# Patient Record
Sex: Female | Born: 1965
Health system: Southern US, Community
[De-identification: ages and names within clinical notes are randomized; demographics above are authoritative.]

## PROBLEM LIST (undated history)

## (undated) DIAGNOSIS — Z8742 Personal history of other diseases of the female genital tract: Secondary | ICD-10-CM

## (undated) DIAGNOSIS — D219 Benign neoplasm of connective and other soft tissue, unspecified: Secondary | ICD-10-CM

## (undated) HISTORY — DX: Personal history of other diseases of the female genital tract: Z87.42

## (undated) HISTORY — DX: Benign neoplasm of connective and other soft tissue, unspecified: D21.9

## (undated) HISTORY — PX: WISDOM TOOTH EXTRACTION: SHX21

---
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
## 1999-01-23 ENCOUNTER — Other Ambulatory Visit: Admission: RE | Admit: 1999-01-23 | Discharge: 1999-01-23 | Payer: Self-pay | Admitting: Obstetrics and Gynecology

## 2000-01-30 ENCOUNTER — Other Ambulatory Visit: Admission: RE | Admit: 2000-01-30 | Discharge: 2000-01-30 | Payer: Self-pay | Admitting: Obstetrics and Gynecology

## 2001-01-18 ENCOUNTER — Other Ambulatory Visit: Admission: RE | Admit: 2001-01-18 | Discharge: 2001-01-18 | Payer: Self-pay | Admitting: Obstetrics and Gynecology

## 2001-09-07 ENCOUNTER — Ambulatory Visit (HOSPITAL_COMMUNITY): Admission: RE | Admit: 2001-09-07 | Discharge: 2001-09-07 | Payer: Self-pay | Admitting: Internal Medicine

## 2001-09-08 HISTORY — PX: LEG SURGERY: SHX1003

## 2002-01-05 ENCOUNTER — Other Ambulatory Visit: Admission: RE | Admit: 2002-01-05 | Discharge: 2002-01-05 | Payer: Self-pay | Admitting: Obstetrics and Gynecology

## 2002-01-13 ENCOUNTER — Encounter: Payer: Self-pay | Admitting: Obstetrics and Gynecology

## 2002-01-13 ENCOUNTER — Ambulatory Visit (HOSPITAL_COMMUNITY): Admission: RE | Admit: 2002-01-13 | Discharge: 2002-01-13 | Payer: Self-pay | Admitting: Obstetrics and Gynecology

## 2003-01-18 ENCOUNTER — Other Ambulatory Visit: Admission: RE | Admit: 2003-01-18 | Discharge: 2003-01-18 | Payer: Self-pay | Admitting: Obstetrics and Gynecology

## 2004-01-10 ENCOUNTER — Other Ambulatory Visit: Admission: RE | Admit: 2004-01-10 | Discharge: 2004-01-10 | Payer: Self-pay | Admitting: Obstetrics and Gynecology

## 2004-12-25 ENCOUNTER — Ambulatory Visit (HOSPITAL_COMMUNITY): Admission: RE | Admit: 2004-12-25 | Discharge: 2004-12-25 | Payer: Self-pay | Admitting: Obstetrics and Gynecology

## 2005-01-15 ENCOUNTER — Other Ambulatory Visit: Admission: RE | Admit: 2005-01-15 | Discharge: 2005-01-15 | Payer: Self-pay | Admitting: Obstetrics and Gynecology

## 2005-12-29 ENCOUNTER — Ambulatory Visit (HOSPITAL_COMMUNITY): Admission: RE | Admit: 2005-12-29 | Discharge: 2005-12-29 | Payer: Self-pay | Admitting: Obstetrics and Gynecology

## 2006-01-12 ENCOUNTER — Other Ambulatory Visit: Admission: RE | Admit: 2006-01-12 | Discharge: 2006-01-12 | Payer: Self-pay | Admitting: Obstetrics and Gynecology

## 2006-08-06 ENCOUNTER — Other Ambulatory Visit: Admission: RE | Admit: 2006-08-06 | Discharge: 2006-08-06 | Payer: Self-pay | Admitting: Obstetrics and Gynecology

## 2006-12-31 ENCOUNTER — Ambulatory Visit (HOSPITAL_COMMUNITY): Admission: RE | Admit: 2006-12-31 | Discharge: 2006-12-31 | Payer: Self-pay | Admitting: Obstetrics and Gynecology

## 2007-02-05 ENCOUNTER — Encounter: Admission: RE | Admit: 2007-02-05 | Discharge: 2007-02-05 | Payer: Self-pay | Admitting: Obstetrics and Gynecology

## 2007-12-27 ENCOUNTER — Ambulatory Visit (HOSPITAL_COMMUNITY): Admission: RE | Admit: 2007-12-27 | Discharge: 2007-12-27 | Payer: Self-pay | Admitting: Obstetrics and Gynecology

## 2008-12-27 ENCOUNTER — Ambulatory Visit (HOSPITAL_COMMUNITY): Admission: RE | Admit: 2008-12-27 | Discharge: 2008-12-27 | Payer: Self-pay | Admitting: Obstetrics and Gynecology

## 2009-12-31 ENCOUNTER — Ambulatory Visit (HOSPITAL_COMMUNITY): Admission: RE | Admit: 2009-12-31 | Discharge: 2009-12-31 | Payer: Self-pay | Admitting: Obstetrics and Gynecology

## 2010-10-31 ENCOUNTER — Other Ambulatory Visit (HOSPITAL_COMMUNITY): Payer: Self-pay | Admitting: Obstetrics and Gynecology

## 2010-10-31 DIAGNOSIS — Z1231 Encounter for screening mammogram for malignant neoplasm of breast: Secondary | ICD-10-CM

## 2011-01-02 ENCOUNTER — Ambulatory Visit (HOSPITAL_COMMUNITY)
Admission: RE | Admit: 2011-01-02 | Discharge: 2011-01-02 | Disposition: A | Discharge status: Home / Self Care | Payer: BC Managed Care – PPO | Source: Ambulatory Visit | Attending: Obstetrics and Gynecology | Admitting: Obstetrics and Gynecology

## 2011-01-02 DIAGNOSIS — Z1231 Encounter for screening mammogram for malignant neoplasm of breast: Secondary | ICD-10-CM

## 2011-09-09 HISTORY — PX: CHOLECYSTECTOMY: SHX55

## 2011-12-09 ENCOUNTER — Other Ambulatory Visit: Payer: Self-pay | Admitting: Obstetrics and Gynecology

## 2011-12-09 DIAGNOSIS — Z1231 Encounter for screening mammogram for malignant neoplasm of breast: Secondary | ICD-10-CM

## 2012-01-05 ENCOUNTER — Ambulatory Visit (HOSPITAL_COMMUNITY)
Admission: RE | Admit: 2012-01-05 | Discharge: 2012-01-05 | Disposition: A | Discharge status: Home / Self Care | Payer: BC Managed Care – PPO | Source: Ambulatory Visit | Attending: Obstetrics and Gynecology | Admitting: Obstetrics and Gynecology

## 2012-01-05 DIAGNOSIS — Z1231 Encounter for screening mammogram for malignant neoplasm of breast: Secondary | ICD-10-CM

## 2012-01-15 ENCOUNTER — Telehealth: Payer: Self-pay | Admitting: Obstetrics and Gynecology

## 2012-01-15 NOTE — Telephone Encounter (Signed)
Triage pool.

## 2012-01-15 NOTE — Telephone Encounter (Signed)
To triage pool.

## 2012-01-15 NOTE — Telephone Encounter (Signed)
[facility] pt 

## 2012-01-16 NOTE — Telephone Encounter (Signed)
Lm on vm to cb per telephone call.  

## 2012-01-16 NOTE — Telephone Encounter (Signed)
Tc from pt. Pt with several ?'s rgdg bcp's. Questions answered. Appt sched 01-19-12 with [facility] for aex. Pt agrees.

## 2012-01-19 ENCOUNTER — Encounter: Payer: Self-pay | Admitting: Obstetrics and Gynecology

## 2012-01-19 ENCOUNTER — Ambulatory Visit (INDEPENDENT_AMBULATORY_CARE_PROVIDER_SITE_OTHER): Payer: BC Managed Care – PPO | Admitting: Obstetrics and Gynecology

## 2012-01-19 VITALS — BP 90/58 | Ht 67.0 in | Wt 136.0 lb

## 2012-01-19 DIAGNOSIS — Z124 Encounter for screening for malignant neoplasm of cervix: Secondary | ICD-10-CM

## 2012-01-19 DIAGNOSIS — N924 Excessive bleeding in the premenopausal period: Secondary | ICD-10-CM

## 2012-01-19 DIAGNOSIS — D251 Intramural leiomyoma of uterus: Secondary | ICD-10-CM

## 2012-01-19 NOTE — Progress Notes (Signed)
Last Pap: 01/03/2010  WNL: Yes Regular Periods:yes Contraception: vas  Monthly Breast exam:yes occ Tetanus<64yrs:no Nl.Bladder Function:yes Daily BMs:yes Healthy Diet:yes Calcium:yes Mammogram:yes 01/06/2012 Exercise:yes Seatbelt: yes Abuse at home: no Stressful work:no Sigmoid-colonoscopy: N/A Bone Density: No BMI=22  Entered by cw,cma  Subjective:    Kelly Andrade is a 46 y.o. female, who presents for annual exam.  The patient has no complaints today. She had a workup for abnormal uterine bleeding and has been placed on oral contraceptive pills with good relief. She has had 3 cycles that were predictable according to him the placebo pills in her package, lasting only a few days, and not at all heavy as they have been in the past. There also on associated with any cramping.  The following portions of the patient's history were reviewed and updated as appropriate: allergies, current medications, past family history, past medical history, past social history, past surgical history and problem list.  Review of Systems Pertinent items are noted in HPI. Gastrointestinal:No change in bowel habits, no abdominal pain, no rectal bleeding Genitourinary:negative for dysuria, frequency, hematuria, nocturia and urinary incontinence    Objective:     BP 90/58  Ht 5\' 7"  (1.702 m)  Wt 136 lb (61.689 kg)  BMI 21.30 kg/m2  LMP 01/09/2012  Weight:  Wt Readings from Last 1 Encounters:  01/19/12 136 lb (61.689 kg)     BMI: Body mass index is 21.30 kg/(m^2). General Appearance: Alert, appropriate appearance for age. No acute distress HEENT: Grossly normal Neck / Thyroid: Supple, no masses, nodes or enlargement Lungs: clear to auscultation bilaterally Back: No CVA tenderness Breast Exam: No masses or nodes.No dimpling, nipple retraction or discharge. Cardiovascular: Regular rate and rhythm. S1, S2, no murmur Gastrointestinal: Soft, non-tender, no masses or organomegaly Pelvic Exam:  External genitalia: normal general appearance Vaginal: normal mucosa without prolapse or lesions Cervix: normal appearance Adnexa: normal bimanual exam Uterus: upper limits normal size mobile and nontender Rectovaginal: normal rectal, no masses Lymphatic Exam: Non-palpable nodes in neck, clavicular, axillary, or inguinal regions Skin: no rash or abnormalities Neurologic: Normal gait and speech, no tremor  Psychiatric: Alert and oriented, appropriate affect.    Urinalysis:Not done      Assessment:    Perimenopausal bleeding pattern improved on combination sequential oral contraceptives  Small asymptomatic uterine fibroid diagnosed on ultrasound and stable on today's exam   Plan:    All questions answered. Diagnosis explained in detail, including differential. Discussed healthy lifestyle modifications. Agricultural engineer distributed. plan to continue combination oral contraceptive pills as currently prescribed for at least one year and reassess at next years annual examination   Follow-up:  for annual exam

## 2012-01-19 NOTE — Patient Instructions (Signed)
Perimenopause  Perimenopause is the time when your body begins to move into the menopause (no menstrual period for 12 straight months). It is a natural process. Perimenopause can begin 2 to 8 years before the menopause and usually lasts for one year after the menopause. During this time, your ovaries may or may not produce an egg. The ovaries vary in their production of estrogen and progesterone hormones each month. This can cause irregular menstrual periods, difficulty in getting pregnant, vaginal bleeding between periods and uncomfortable symptoms.  CAUSES   Irregular production of the ovarian hormones, estrogen and progesterone, and not ovulating every month.   Other causes include:   Tumor of the pituitary gland in the brain.   Medical disease that affects the ovaries.   Radiation treatment.   Chemotherapy.   Unknown causes.   Heavy smoking and excessive alcohol intake can bring on perimenopause sooner.  SYMPTOMS     Hot flashes.   Night sweats.   Irregular menstrual periods.   Decrease sex drive.   Vaginal dryness.   Headaches.   Mood swings.   Depression.   Memory problems.   Irritability.   Tiredness.   Weight gain.   Trouble getting pregnant.   The beginning of losing bone cells (osteoporosis).   The beginning of hardening of the arteries (atherosclerosis).  DIAGNOSIS    Your caregiver will make a diagnosis by analyzing your age, menstrual history and your symptoms. They will do a physical exam noting any changes in your body, especially your female organs. Female hormone tests may or may not be helpful depending on the amount and when you produce the female hormones. However, other hormone tests may be helpful (ex. thyroid hormone) to rule out other problems.  TREATMENT    The decision to treat during the perimenopause should be made by you and your caregiver depending on how the symptoms are affecting you and your life style. There are various treatments available such as:    Treating individual symptoms with a specific medication for that symptom (ex. tranquilizer for depression).   Herbal medications that can help specific symptoms.   Counseling.   Group therapy.   No treatment.  HOME CARE INSTRUCTIONS     Before seeing your caregiver, make a list of your menstrual periods (when the occur, how heavy they are, how long between periods and how long they last), your symptoms and when they started.   Take the medication as recommended by your caregiver.   Sleep and rest.   Exercise.   Eat a diet that contains calcium (good for your bones) and soy (acts like estrogen hormone).   Do not smoke.   Avoid alcoholic beverages.   Taking vitamin E may help in certain cases.   Take calcium and vitamin D supplements to help prevent bone loss.   Group therapy is sometimes helpful.   Acupuncture may help in some cases.  SEEK MEDICAL CARE IF:     You have any of the above and want to know if it is perimenopause.   You want advice and treatment for any of your symptoms mentioned above.   You need a referral to a specialist (gynecologist, psychiatrist or psychologist).  SEEK IMMEDIATE MEDICAL CARE IF:     You have vaginal bleeding.   Your period lasts longer than 8 days.   You periods are recurring sooner than 21 days.   You have bleeding after intercourse.   You have severe depression.   You have pain   when you urinate.   You have severe headaches.   You develop vision problems.  Document Released: 10/02/2004 Document Revised: 08/14/2011 Document Reviewed: 06/22/2008  [employer] Patient Information 2012 [employer].

## 2012-01-21 LAB — PAP IG AND HPV HIGH-RISK: HPV DNA High Risk: NOT DETECTED

## 2012-01-27 ENCOUNTER — Ambulatory Visit: Payer: Self-pay | Admitting: Obstetrics and Gynecology

## 2012-03-30 ENCOUNTER — Other Ambulatory Visit: Payer: Self-pay | Admitting: Obstetrics and Gynecology

## 2012-03-30 ENCOUNTER — Encounter: Payer: Self-pay | Admitting: Obstetrics and Gynecology

## 2012-03-30 MED ORDER — NORETHINDRONE ACET-ETHINYL EST 1-20 MG-MCG PO TABS: 1.0000 | ORAL_TABLET | Freq: Every day | ORAL | Status: DC

## 2012-03-30 NOTE — Telephone Encounter (Signed)
Chandra/[facility] pt °

## 2012-03-30 NOTE — Telephone Encounter (Signed)
Tc to pt per telephone call.  Pt needs rf for bc. Rx for Microgestin 1/20 on file e-pres to pharm on file. Pt voices understanding.

## 2012-08-09 ENCOUNTER — Telehealth: Payer: Self-pay

## 2012-08-09 NOTE — Telephone Encounter (Signed)
Lm on vm to cb per telephone voicemail rgdg menses.

## 2012-08-09 NOTE — Telephone Encounter (Signed)
Tc from pt. Pt c/o spotting starting on yesterday. Pt is on the end of 2nd week of her Junel 1/20. Pt admits to taking pills late x 2days(08/06/12 and 08/07/12). Informed pt spotting likely due to taking pills late. Pt admits to always taking pills on time. Informed pt to cont bcp's as directed. If irregular spotting reoccurs to call office. Pt voices understanding.

## 2012-10-08 ENCOUNTER — Telehealth: Payer: Self-pay | Admitting: Obstetrics and Gynecology

## 2012-10-08 NOTE — Telephone Encounter (Signed)
Msg forwarded to [facility]

## 2012-10-08 NOTE — Telephone Encounter (Signed)
Lm with female for pt to cb.

## 2012-10-08 NOTE — Telephone Encounter (Signed)
Tc from pt per telephone call. Pt still c/o irregular spotting with Junel 1/20. Pt denies missing any pills. LMP-approx 09/18/12 x 3-4 days vs normal 7 day cycle with lighter flow. Pt is now on in her 3rd week of pill pack. Will consult with [facility] per recs. Pt voices understanding.

## 2012-10-10 NOTE — Telephone Encounter (Signed)
pls schedule sonohyst at beginning of next pill pack

## 2012-10-11 ENCOUNTER — Other Ambulatory Visit: Payer: Self-pay

## 2012-10-11 DIAGNOSIS — N926 Irregular menstruation, unspecified: Secondary | ICD-10-CM

## 2012-10-11 MED ORDER — MISOPROSTOL 200 MCG PO TABS: ORAL_TABLET | ORAL | Status: DC

## 2012-10-11 NOTE — Addendum Note (Signed)
Addended by: Lerry Liner D on: 10/11/2012 02:05 PM   Modules accepted: Orders

## 2012-10-11 NOTE — Telephone Encounter (Signed)
Lm on vm to cb per [facility] recs.  

## 2012-10-11 NOTE — Telephone Encounter (Signed)
Tc from pt. SHG sched 10/26/12 @ 3:30p with [facility]. Instructions for [facility] and [facility] mailed to pt. Pt due to start pills on 10/20/12. Pt agrees.

## 2012-10-12 ENCOUNTER — Other Ambulatory Visit: Payer: Self-pay | Admitting: Obstetrics and Gynecology

## 2012-10-12 DIAGNOSIS — N926 Irregular menstruation, unspecified: Secondary | ICD-10-CM

## 2012-10-12 NOTE — Telephone Encounter (Signed)
Review of chart reveals pt had SHG 06/2011.  Will not repeat at this time.  Will plan to see pt at visit for plan of care.

## 2012-10-13 NOTE — Telephone Encounter (Signed)
Tc to pt per [facility] recs.  Appt sched 10/15/12 @ 1:00p. Pt agrees.

## 2012-10-14 DIAGNOSIS — Z8742 Personal history of other diseases of the female genital tract: Secondary | ICD-10-CM | POA: Insufficient documentation

## 2012-10-14 DIAGNOSIS — D219 Benign neoplasm of connective and other soft tissue, unspecified: Secondary | ICD-10-CM | POA: Insufficient documentation

## 2012-10-15 ENCOUNTER — Encounter: Payer: Self-pay | Admitting: Obstetrics and Gynecology

## 2012-10-15 ENCOUNTER — Ambulatory Visit: Payer: BC Managed Care – PPO | Admitting: Obstetrics and Gynecology

## 2012-10-15 VITALS — BP 92/60 | Wt 143.0 lb

## 2012-10-15 DIAGNOSIS — N939 Abnormal uterine and vaginal bleeding, unspecified: Secondary | ICD-10-CM

## 2012-10-15 NOTE — Progress Notes (Signed)
GYN PROBLEM VISIT  Subjective: When did bleeding start: 10/08/2012.  Had similar episode in 08/08/12 with pill pack ending on 08/17/12, but no withdrawal bleeding occurred that cycle. January withdrawal cycle was normal, this bleeding started 6 days prior to end of Junel1/20. Has had nl menses since starting pills in 10/2011.  But her spontaneous menses have always been irregular How  Long: Still Bleeding  How often changing pad/tampon: 2-3 times a day  Bleeding Disorders: no Cramping: yes Contraception: yes Fibroids: no Hormone Therapy: no New Medications: no Menopausal Symptoms: no Vag. Discharge: no Abdominal Pain: no Increased Stress: yes.  Tax season at work.  Husband s/p several eye surgeries  Objective: BP 92/60  Wt 143 lb (64.864 kg)  BMI 22.39 kg/m2  LMP 10/08/2012  Pelvic exam:   VULVA: normal appearing vulva with no masses, tenderness or lesions,     VAGINA: normal appearing vagina with normal color and discharge, no lesions,     CERVIX: normal appearing cervix without discharge or lesions,     UTERUS: enlarged to 8 week's size, irregular, mobile,     ADNEXA: normal adnexa in size, nontender and no masses Assessment: Recurrent irreg menses after almost a year of regular menses on BCPs  Rec: SHG and possible endobx.  Last done about 2 years ago.

## 2012-10-19 ENCOUNTER — Other Ambulatory Visit: Payer: Self-pay

## 2012-10-19 MED ORDER — MISOPROSTOL 200 MCG PO TABS: ORAL_TABLET | ORAL | Status: DC

## 2012-10-26 ENCOUNTER — Ambulatory Visit: Payer: BC Managed Care – PPO

## 2012-10-26 ENCOUNTER — Encounter: Payer: Self-pay | Admitting: Obstetrics and Gynecology

## 2012-10-26 ENCOUNTER — Other Ambulatory Visit: Payer: Self-pay | Admitting: Obstetrics and Gynecology

## 2012-10-26 ENCOUNTER — Other Ambulatory Visit: Payer: BC Managed Care – PPO

## 2012-10-26 ENCOUNTER — Ambulatory Visit: Payer: BC Managed Care – PPO | Admitting: Obstetrics and Gynecology

## 2012-10-26 ENCOUNTER — Encounter: Payer: BC Managed Care – PPO | Admitting: Obstetrics and Gynecology

## 2012-10-26 VITALS — BP 106/68

## 2012-10-26 DIAGNOSIS — N926 Irregular menstruation, unspecified: Secondary | ICD-10-CM

## 2012-10-26 DIAGNOSIS — IMO0002 Reserved for concepts with insufficient information to code with codable children: Secondary | ICD-10-CM

## 2012-10-26 DIAGNOSIS — N939 Abnormal uterine and vaginal bleeding, unspecified: Secondary | ICD-10-CM

## 2012-10-26 NOTE — Patient Instructions (Signed)

## 2012-10-26 NOTE — Progress Notes (Signed)
SONOHYSTEROGRAM  Indications for the procedure, risks and benefits have been reviewed.  Questions were answered.   A permit has been signed.  An ultrasound was performed.   PROCEDURE The vagina and cervix were prepped with Betadine.  The sonohysterogram catheter was placed inside the uterus.  15 cc of sterile saline were injected.  A 3-D ultrasound was performed..   The patient tolerated her procedure well.  All instruments were removed.  The patient was returned to the supine position.   Findings  ULTRASOUND: Uterus: Length: 6.53 cm   Width:  6.00 cm   Height:  5.30 cm Endo thickness:  4.05 mm   Left ovary:Normal Right ovary:Normal Fibroids:no  CDS fluid:no Comment: Endometrial cavity width=3.1cm. Endometrial cavity length-4.8cm. Normal adnexas. Normal endometrium.  No endometrial masses on 3D rendering  BP 106/68  LMP 10/08/2012  Assessment:   Abnormal uterine bleeding without lesion noted Long hx of abnl uterine bleeding and amenorrhea.  Plan: Options for managemnt which had been reviewed with pt about 18 mos ago were again reviewed.  She declined IUD, ablation, hysterectomy, and wants to continue her current BCPs as long as no health threatening lesion is found. Menstrual calendar.  Will call with more than 10 days of consecutve bleeding RTO in 3 months

## 2012-11-10 ENCOUNTER — Other Ambulatory Visit: Payer: Self-pay | Admitting: Obstetrics and Gynecology

## 2012-11-10 DIAGNOSIS — Z1231 Encounter for screening mammogram for malignant neoplasm of breast: Secondary | ICD-10-CM

## 2013-01-05 ENCOUNTER — Ambulatory Visit (HOSPITAL_COMMUNITY)
Admission: RE | Admit: 2013-01-05 | Discharge: 2013-01-05 | Disposition: A | Discharge status: Home / Self Care | Payer: BC Managed Care – PPO | Source: Ambulatory Visit | Attending: Obstetrics and Gynecology | Admitting: Obstetrics and Gynecology

## 2013-01-05 DIAGNOSIS — Z1231 Encounter for screening mammogram for malignant neoplasm of breast: Secondary | ICD-10-CM | POA: Insufficient documentation

## 2013-01-06 ENCOUNTER — Other Ambulatory Visit: Payer: Self-pay | Admitting: Obstetrics and Gynecology

## 2013-01-06 DIAGNOSIS — R928 Other abnormal and inconclusive findings on diagnostic imaging of breast: Secondary | ICD-10-CM

## 2013-01-20 ENCOUNTER — Ambulatory Visit
Admission: RE | Admit: 2013-01-20 | Discharge: 2013-01-20 | Disposition: A | Discharge status: Home / Self Care | Payer: BC Managed Care – PPO | Source: Ambulatory Visit | Attending: Obstetrics and Gynecology | Admitting: Obstetrics and Gynecology

## 2013-01-20 DIAGNOSIS — R928 Other abnormal and inconclusive findings on diagnostic imaging of breast: Secondary | ICD-10-CM

## 2013-06-13 ENCOUNTER — Other Ambulatory Visit: Payer: Self-pay | Admitting: Obstetrics and Gynecology

## 2013-06-13 DIAGNOSIS — R921 Mammographic calcification found on diagnostic imaging of breast: Secondary | ICD-10-CM

## 2013-07-20 ENCOUNTER — Ambulatory Visit
Admission: RE | Admit: 2013-07-20 | Discharge: 2013-07-20 | Disposition: A | Discharge status: Home / Self Care | Payer: BC Managed Care – PPO | Source: Ambulatory Visit | Attending: Obstetrics and Gynecology | Admitting: Obstetrics and Gynecology

## 2013-07-20 DIAGNOSIS — R921 Mammographic calcification found on diagnostic imaging of breast: Secondary | ICD-10-CM

## 2013-11-07 ENCOUNTER — Other Ambulatory Visit: Payer: Self-pay | Admitting: Obstetrics and Gynecology

## 2013-11-07 DIAGNOSIS — R921 Mammographic calcification found on diagnostic imaging of breast: Secondary | ICD-10-CM

## 2014-01-09 ENCOUNTER — Encounter (INDEPENDENT_AMBULATORY_CARE_PROVIDER_SITE_OTHER): Payer: Self-pay

## 2014-01-09 ENCOUNTER — Ambulatory Visit
Admission: RE | Admit: 2014-01-09 | Discharge: 2014-01-09 | Disposition: A | Discharge status: Home / Self Care | Payer: BC Managed Care – PPO | Source: Ambulatory Visit | Attending: Obstetrics and Gynecology | Admitting: Obstetrics and Gynecology

## 2014-01-09 DIAGNOSIS — R921 Mammographic calcification found on diagnostic imaging of breast: Secondary | ICD-10-CM

## 2014-07-10 ENCOUNTER — Encounter: Payer: Self-pay | Admitting: Obstetrics and Gynecology

## 2014-10-31 ENCOUNTER — Other Ambulatory Visit: Payer: Self-pay | Admitting: Obstetrics and Gynecology

## 2014-10-31 DIAGNOSIS — R921 Mammographic calcification found on diagnostic imaging of breast: Secondary | ICD-10-CM

## 2015-01-16 ENCOUNTER — Ambulatory Visit
Admission: RE | Admit: 2015-01-16 | Discharge: 2015-01-16 | Disposition: A | Discharge status: Home / Self Care | Payer: BLUE CROSS/BLUE SHIELD | Source: Ambulatory Visit | Attending: Obstetrics and Gynecology | Admitting: Obstetrics and Gynecology

## 2015-01-16 ENCOUNTER — Other Ambulatory Visit: Payer: Self-pay | Admitting: Obstetrics and Gynecology

## 2015-01-16 DIAGNOSIS — R921 Mammographic calcification found on diagnostic imaging of breast: Secondary | ICD-10-CM

## 2016-04-14 DIAGNOSIS — Z0000 Encounter for general adult medical examination without abnormal findings: Secondary | ICD-10-CM | POA: Diagnosis not present

## 2016-04-14 DIAGNOSIS — Z131 Encounter for screening for diabetes mellitus: Secondary | ICD-10-CM | POA: Diagnosis not present

## 2016-04-14 DIAGNOSIS — Z13220 Encounter for screening for lipoid disorders: Secondary | ICD-10-CM | POA: Diagnosis not present

## 2016-09-29 DIAGNOSIS — H6980 Other specified disorders of Eustachian tube, unspecified ear: Secondary | ICD-10-CM | POA: Diagnosis not present

## 2016-10-02 DIAGNOSIS — Z23 Encounter for immunization: Secondary | ICD-10-CM | POA: Diagnosis not present

## 2016-12-29 DIAGNOSIS — N926 Irregular menstruation, unspecified: Secondary | ICD-10-CM | POA: Diagnosis not present

## 2016-12-29 DIAGNOSIS — Z1231 Encounter for screening mammogram for malignant neoplasm of breast: Secondary | ICD-10-CM | POA: Diagnosis not present

## 2016-12-29 DIAGNOSIS — Z01411 Encounter for gynecological examination (general) (routine) with abnormal findings: Secondary | ICD-10-CM | POA: Diagnosis not present

## 2016-12-29 DIAGNOSIS — D259 Leiomyoma of uterus, unspecified: Secondary | ICD-10-CM | POA: Diagnosis not present

## 2016-12-29 DIAGNOSIS — Z124 Encounter for screening for malignant neoplasm of cervix: Secondary | ICD-10-CM | POA: Diagnosis not present

## 2017-01-05 DIAGNOSIS — Z1283 Encounter for screening for malignant neoplasm of skin: Secondary | ICD-10-CM | POA: Diagnosis not present

## 2017-01-05 DIAGNOSIS — L821 Other seborrheic keratosis: Secondary | ICD-10-CM | POA: Diagnosis not present

## 2017-01-29 DIAGNOSIS — N951 Menopausal and female climacteric states: Secondary | ICD-10-CM | POA: Diagnosis not present

## 2017-04-07 DIAGNOSIS — Z13220 Encounter for screening for lipoid disorders: Secondary | ICD-10-CM | POA: Diagnosis not present

## 2017-04-07 DIAGNOSIS — Z131 Encounter for screening for diabetes mellitus: Secondary | ICD-10-CM | POA: Diagnosis not present

## 2017-04-07 DIAGNOSIS — Z0000 Encounter for general adult medical examination without abnormal findings: Secondary | ICD-10-CM | POA: Diagnosis not present

## 2017-10-12 DIAGNOSIS — K648 Other hemorrhoids: Secondary | ICD-10-CM | POA: Diagnosis not present

## 2017-10-12 DIAGNOSIS — K5900 Constipation, unspecified: Secondary | ICD-10-CM | POA: Diagnosis not present

## 2017-10-20 DIAGNOSIS — S0501XA Injury of conjunctiva and corneal abrasion without foreign body, right eye, initial encounter: Secondary | ICD-10-CM | POA: Diagnosis not present

## 2017-12-28 DIAGNOSIS — N926 Irregular menstruation, unspecified: Secondary | ICD-10-CM | POA: Diagnosis not present

## 2017-12-28 DIAGNOSIS — Z01411 Encounter for gynecological examination (general) (routine) with abnormal findings: Secondary | ICD-10-CM | POA: Diagnosis not present

## 2017-12-28 DIAGNOSIS — D259 Leiomyoma of uterus, unspecified: Secondary | ICD-10-CM | POA: Diagnosis not present

## 2017-12-30 DIAGNOSIS — Z1231 Encounter for screening mammogram for malignant neoplasm of breast: Secondary | ICD-10-CM | POA: Diagnosis not present

## 2018-01-05 ENCOUNTER — Other Ambulatory Visit: Payer: Self-pay | Admitting: Obstetrics and Gynecology

## 2018-01-05 DIAGNOSIS — R928 Other abnormal and inconclusive findings on diagnostic imaging of breast: Secondary | ICD-10-CM

## 2018-01-05 DIAGNOSIS — Z23 Encounter for immunization: Secondary | ICD-10-CM | POA: Diagnosis not present

## 2018-01-07 ENCOUNTER — Ambulatory Visit
Admission: RE | Admit: 2018-01-07 | Discharge: 2018-01-07 | Disposition: A | Discharge status: Home / Self Care | Payer: BLUE CROSS/BLUE SHIELD | Source: Ambulatory Visit | Attending: Obstetrics and Gynecology | Admitting: Obstetrics and Gynecology

## 2018-01-07 DIAGNOSIS — N6311 Unspecified lump in the right breast, upper outer quadrant: Secondary | ICD-10-CM | POA: Diagnosis not present

## 2018-01-07 DIAGNOSIS — R928 Other abnormal and inconclusive findings on diagnostic imaging of breast: Secondary | ICD-10-CM

## 2018-01-07 DIAGNOSIS — R922 Inconclusive mammogram: Secondary | ICD-10-CM | POA: Diagnosis not present

## 2018-02-03 DIAGNOSIS — D225 Melanocytic nevi of trunk: Secondary | ICD-10-CM | POA: Diagnosis not present

## 2018-02-03 DIAGNOSIS — Z1283 Encounter for screening for malignant neoplasm of skin: Secondary | ICD-10-CM | POA: Diagnosis not present

## 2018-02-03 DIAGNOSIS — J040 Acute laryngitis: Secondary | ICD-10-CM | POA: Diagnosis not present

## 2018-03-03 DIAGNOSIS — Z0001 Encounter for general adult medical examination with abnormal findings: Secondary | ICD-10-CM | POA: Diagnosis not present

## 2018-04-01 DIAGNOSIS — N926 Irregular menstruation, unspecified: Secondary | ICD-10-CM | POA: Diagnosis not present

## 2018-04-01 DIAGNOSIS — D259 Leiomyoma of uterus, unspecified: Secondary | ICD-10-CM | POA: Diagnosis not present

## 2018-04-01 DIAGNOSIS — N958 Other specified menopausal and perimenopausal disorders: Secondary | ICD-10-CM | POA: Diagnosis not present

## 2018-04-14 DIAGNOSIS — N958 Other specified menopausal and perimenopausal disorders: Secondary | ICD-10-CM | POA: Diagnosis not present

## 2018-04-14 DIAGNOSIS — N939 Abnormal uterine and vaginal bleeding, unspecified: Secondary | ICD-10-CM | POA: Diagnosis not present

## 2018-09-17 ENCOUNTER — Other Ambulatory Visit: Payer: Self-pay | Admitting: Obstetrics & Gynecology

## 2018-09-17 DIAGNOSIS — Z1231 Encounter for screening mammogram for malignant neoplasm of breast: Secondary | ICD-10-CM

## 2019-01-03 ENCOUNTER — Ambulatory Visit: Payer: BLUE CROSS/BLUE SHIELD

## 2019-01-18 DIAGNOSIS — Z8342 Family history of familial hypercholesterolemia: Secondary | ICD-10-CM | POA: Diagnosis not present

## 2019-01-18 DIAGNOSIS — Z131 Encounter for screening for diabetes mellitus: Secondary | ICD-10-CM | POA: Diagnosis not present

## 2019-01-18 DIAGNOSIS — Z0000 Encounter for general adult medical examination without abnormal findings: Secondary | ICD-10-CM | POA: Diagnosis not present

## 2019-02-02 DIAGNOSIS — L821 Other seborrheic keratosis: Secondary | ICD-10-CM | POA: Diagnosis not present

## 2019-02-02 DIAGNOSIS — Z1283 Encounter for screening for malignant neoplasm of skin: Secondary | ICD-10-CM | POA: Diagnosis not present

## 2019-02-14 ENCOUNTER — Other Ambulatory Visit: Payer: Self-pay

## 2019-02-14 ENCOUNTER — Ambulatory Visit
Admission: RE | Admit: 2019-02-14 | Discharge: 2019-02-14 | Disposition: A | Discharge status: Home / Self Care | Payer: BLUE CROSS/BLUE SHIELD | Source: Ambulatory Visit | Attending: Obstetrics & Gynecology | Admitting: Obstetrics & Gynecology

## 2019-02-14 DIAGNOSIS — Z1231 Encounter for screening mammogram for malignant neoplasm of breast: Secondary | ICD-10-CM

## 2019-02-17 DIAGNOSIS — Z01419 Encounter for gynecological examination (general) (routine) without abnormal findings: Secondary | ICD-10-CM | POA: Diagnosis not present

## 2019-04-01 DIAGNOSIS — R61 Generalized hyperhidrosis: Secondary | ICD-10-CM | POA: Diagnosis not present

## 2019-04-07 DIAGNOSIS — Z780 Asymptomatic menopausal state: Secondary | ICD-10-CM | POA: Diagnosis not present

## 2019-04-07 DIAGNOSIS — R61 Generalized hyperhidrosis: Secondary | ICD-10-CM | POA: Diagnosis not present

## 2019-05-13 DIAGNOSIS — Z780 Asymptomatic menopausal state: Secondary | ICD-10-CM | POA: Diagnosis not present

## 2019-05-13 DIAGNOSIS — R61 Generalized hyperhidrosis: Secondary | ICD-10-CM | POA: Diagnosis not present

## 2019-06-03 DIAGNOSIS — Z6831 Body mass index (BMI) 31.0-31.9, adult: Secondary | ICD-10-CM | POA: Diagnosis not present

## 2019-06-03 DIAGNOSIS — Z20828 Contact with and (suspected) exposure to other viral communicable diseases: Secondary | ICD-10-CM | POA: Diagnosis not present

## 2019-08-25 DIAGNOSIS — S61209A Unspecified open wound of unspecified finger without damage to nail, initial encounter: Secondary | ICD-10-CM | POA: Diagnosis not present

## 2019-10-11 ENCOUNTER — Other Ambulatory Visit: Payer: Self-pay | Admitting: Obstetrics & Gynecology

## 2019-10-11 DIAGNOSIS — Z1231 Encounter for screening mammogram for malignant neoplasm of breast: Secondary | ICD-10-CM

## 2020-01-05 DIAGNOSIS — Z01411 Encounter for gynecological examination (general) (routine) with abnormal findings: Secondary | ICD-10-CM | POA: Diagnosis not present

## 2020-02-01 DIAGNOSIS — Z1283 Encounter for screening for malignant neoplasm of skin: Secondary | ICD-10-CM | POA: Diagnosis not present

## 2020-02-01 DIAGNOSIS — D225 Melanocytic nevi of trunk: Secondary | ICD-10-CM | POA: Diagnosis not present

## 2020-02-08 DIAGNOSIS — Z5181 Encounter for therapeutic drug level monitoring: Secondary | ICD-10-CM | POA: Diagnosis not present

## 2020-02-08 DIAGNOSIS — B351 Tinea unguium: Secondary | ICD-10-CM | POA: Diagnosis not present

## 2020-02-08 DIAGNOSIS — M79672 Pain in left foot: Secondary | ICD-10-CM | POA: Diagnosis not present

## 2020-02-08 DIAGNOSIS — Z13220 Encounter for screening for lipoid disorders: Secondary | ICD-10-CM | POA: Diagnosis not present

## 2020-02-08 DIAGNOSIS — Z0000 Encounter for general adult medical examination without abnormal findings: Secondary | ICD-10-CM | POA: Diagnosis not present

## 2020-02-15 ENCOUNTER — Ambulatory Visit
Admission: RE | Admit: 2020-02-15 | Discharge: 2020-02-15 | Disposition: A | Discharge status: Home / Self Care | Payer: BC Managed Care – PPO | Source: Ambulatory Visit | Attending: Obstetrics & Gynecology | Admitting: Obstetrics & Gynecology

## 2020-02-15 ENCOUNTER — Other Ambulatory Visit: Payer: Self-pay

## 2020-02-15 DIAGNOSIS — Z1231 Encounter for screening mammogram for malignant neoplasm of breast: Secondary | ICD-10-CM

## 2020-02-17 ENCOUNTER — Other Ambulatory Visit: Payer: Self-pay

## 2020-02-17 ENCOUNTER — Ambulatory Visit: Payer: BC Managed Care – PPO | Admitting: Podiatry

## 2020-02-17 ENCOUNTER — Ambulatory Visit (INDEPENDENT_AMBULATORY_CARE_PROVIDER_SITE_OTHER): Payer: BC Managed Care – PPO

## 2020-02-17 ENCOUNTER — Encounter: Payer: Self-pay | Admitting: Podiatry

## 2020-02-17 ENCOUNTER — Other Ambulatory Visit: Payer: Self-pay | Admitting: Obstetrics & Gynecology

## 2020-02-17 DIAGNOSIS — M79672 Pain in left foot: Secondary | ICD-10-CM

## 2020-02-17 DIAGNOSIS — R928 Other abnormal and inconclusive findings on diagnostic imaging of breast: Secondary | ICD-10-CM

## 2020-02-17 DIAGNOSIS — Q667 Congenital pes cavus, unspecified foot: Secondary | ICD-10-CM

## 2020-02-17 DIAGNOSIS — L909 Atrophic disorder of skin, unspecified: Secondary | ICD-10-CM | POA: Diagnosis not present

## 2020-02-19 ENCOUNTER — Encounter: Payer: Self-pay | Admitting: Podiatry

## 2020-02-19 NOTE — Progress Notes (Signed)
Subjective:  Patient ID: Kelly Andrade, female    DOB: 1965-09-21,  MRN: [id number]  Chief Complaint  Patient presents with  . Foot Pain    pt is here for left foot pain, pt states that the left foot pain is painful on and off, pt is concerned with the aching sensation as well.    54 y.o. female presents with the above complaint.  Patient presents with left heel pain that has been going on for quite some time.  Patient does not have plantar fasciitis however pain is more on the posterior and lateral side of the heel.  Patient states been going for 6 months.  Patient and states the pain is on and off is aching sensation.  Pain scale 7 out of 10.  Pain is also there when laying in bed.  Patient has tried stretching it but has not helped.  She does not have a typical Po static dyskinesia type of symptoms.  She has not seen anyone else prior to seeing me.  She denies any other acute complaints.   Review of Systems: Negative except as noted in the HPI. Denies N/V/F/Ch.  Past Medical History:  Diagnosis Date  . Fibroids   . H/O menorrhagia   . Hx of ovarian cyst     Current Outpatient Medications:  Marland Kitchen  Multiple Minerals (CALCIUM/MAGNESIUM/ZINC PO), Take 1 tablet by mouth daily., Disp: , Rfl:  .  Multiple Vitamins-Minerals (MULTIVITAL PO), Take 1 tablet by mouth daily., Disp: , Rfl:  .  misoprostol (CYTOTEC) 200 MCG tablet, Pt to insert 1 tablet in vagina 12 hours prior to procedure;then insert 1 tablet in vagina 6 hours prior to procedure., Disp: 2 tablet, Rfl: 0 .  norethindrone-ethinyl estradiol (MICROGESTIN,JUNEL,LOESTRIN) 1-20 MG-MCG tablet, Take 1 tablet by mouth daily., Disp: 3 Package, Rfl: 3  Social History   Tobacco Use  Smoking Status Never Smoker  Smokeless Tobacco Never Used    Allergies  Allergen Reactions  . Benadryl [Diphenhydramine Hcl]     Caused hyperactivity as a child. Caused migraines as a teen   Objective:  There were no vitals filed for this visit. There is  no height or weight on file to calculate BMI. Constitutional Well developed. Well nourished.  Vascular Dorsalis pedis pulses palpable bilaterally. Posterior tibial pulses palpable bilaterally. Capillary refill normal to all digits.  No cyanosis or clubbing noted. Pedal hair growth normal.  Neurologic Normal speech. Oriented to person, place, and time. Epicritic sensation to light touch grossly present bilaterally.  Dermatologic Nails well groomed and normal in appearance. No open wounds. No skin lesions.  Orthopedic: Normal joint ROM without pain or crepitus bilaterally. No visible deformities. Pain on palpation to the posterior lateral heel.  No pain at the medial calcaneal tuber.  No pain along the course of the plantar fasciitis.  No pain of the Achilles tendon insertion.  No pain at the ATFL posterior tibial or peroneal tendons.  Mild atrophy of the plantar fat pad noted upon clinical palpation.   Radiographs: 3 views of skeletally mature adult left foot: There is an increase in calcaneal inclination angle.  There is a increase in talar declination angle.  Mild midfoot arthritis noted.  Plantar and posterior heel spurring noted.  Patient is experiencing her exhibiting pes cavus foot structure   Assessment:   1. Foot pain, left   2. Plantar fat pad atrophy   3. Pes cavus, congenital    Plan:  Patient was evaluated and treated and all questions answered.  Left plantar fat pad atrophy -I explained to the patient the etiology of plantar fat pad atrophy and various treatment options were extensively discussed.  Given that she is losing some of the plantar fat pad likely due to pes cavus foot structure with anterior displacement of the fat pad with ambulation.  I believe patient will benefit from heel pad and cushioning to help stress away from decreasing fat pad to the heel and support the arch of the foot. -I believe she will also benefit from custom-made orthotics -Heel lifts/heel  cushions were dispensed.  Semiflexible pes cavus foot structure -I explained patient the etiology of pes cavus and various treatment options were discussed.  I believe patient will benefit from custom-made orthotics to help support the arch of the foot as well as take the stress off of the heel given that she is also having a little bit of plantar fat pad atrophy.  No follow-ups on file.

## 2020-02-21 ENCOUNTER — Ambulatory Visit (INDEPENDENT_AMBULATORY_CARE_PROVIDER_SITE_OTHER): Payer: BC Managed Care – PPO | Admitting: Orthotics

## 2020-02-21 ENCOUNTER — Other Ambulatory Visit: Payer: Self-pay

## 2020-02-21 DIAGNOSIS — Q6672 Congenital pes cavus, left foot: Secondary | ICD-10-CM

## 2020-02-21 DIAGNOSIS — Q6671 Congenital pes cavus, right foot: Secondary | ICD-10-CM

## 2020-02-21 DIAGNOSIS — L909 Atrophic disorder of skin, unspecified: Secondary | ICD-10-CM

## 2020-02-21 DIAGNOSIS — Q667 Congenital pes cavus, unspecified foot: Secondary | ICD-10-CM

## 2020-02-21 DIAGNOSIS — M79672 Pain in left foot: Secondary | ICD-10-CM

## 2020-02-21 NOTE — Progress Notes (Signed)
Patient came into today to be cast for Custom Foot Orthotics. Upon recommendation of Dr. Posey Pronto Patient presents with fat pad atrophy heel Goals are Plan vendor

## 2020-02-23 ENCOUNTER — Other Ambulatory Visit: Payer: Self-pay

## 2020-02-23 ENCOUNTER — Ambulatory Visit
Admission: RE | Admit: 2020-02-23 | Discharge: 2020-02-23 | Disposition: A | Discharge status: Home / Self Care | Payer: BC Managed Care – PPO | Source: Ambulatory Visit | Attending: Obstetrics & Gynecology | Admitting: Obstetrics & Gynecology

## 2020-02-23 DIAGNOSIS — N6313 Unspecified lump in the right breast, lower outer quadrant: Secondary | ICD-10-CM | POA: Diagnosis not present

## 2020-02-23 DIAGNOSIS — R928 Other abnormal and inconclusive findings on diagnostic imaging of breast: Secondary | ICD-10-CM

## 2020-02-27 ENCOUNTER — Other Ambulatory Visit: Payer: BC Managed Care – PPO

## 2020-03-13 ENCOUNTER — Ambulatory Visit: Payer: BC Managed Care – PPO | Admitting: Orthotics

## 2020-03-13 ENCOUNTER — Other Ambulatory Visit: Payer: BC Managed Care – PPO | Admitting: Orthotics

## 2020-03-13 ENCOUNTER — Other Ambulatory Visit: Payer: Self-pay

## 2020-03-13 DIAGNOSIS — Q667 Congenital pes cavus, unspecified foot: Secondary | ICD-10-CM

## 2020-03-13 DIAGNOSIS — L909 Atrophic disorder of skin, unspecified: Secondary | ICD-10-CM

## 2020-03-13 DIAGNOSIS — M79672 Pain in left foot: Secondary | ICD-10-CM

## 2020-03-13 NOTE — Progress Notes (Signed)
Patient came in today to pick up custom made foot orthotics.  The goals were accomplished and the patient reported no dissatisfaction with said orthotics.  Patient was advised of breakin period and how to report any issues. 

## 2020-03-14 ENCOUNTER — Other Ambulatory Visit: Payer: BC Managed Care – PPO

## 2020-03-16 DIAGNOSIS — Z23 Encounter for immunization: Secondary | ICD-10-CM | POA: Diagnosis not present

## 2020-03-29 ENCOUNTER — Other Ambulatory Visit: Payer: Self-pay | Admitting: Podiatry

## 2020-03-29 DIAGNOSIS — L909 Atrophic disorder of skin, unspecified: Secondary | ICD-10-CM

## 2020-03-30 ENCOUNTER — Encounter: Payer: Self-pay | Admitting: Podiatry

## 2020-03-30 ENCOUNTER — Ambulatory Visit (INDEPENDENT_AMBULATORY_CARE_PROVIDER_SITE_OTHER): Payer: BC Managed Care – PPO | Admitting: Podiatry

## 2020-03-30 ENCOUNTER — Other Ambulatory Visit: Payer: Self-pay

## 2020-03-30 DIAGNOSIS — M79672 Pain in left foot: Secondary | ICD-10-CM

## 2020-03-30 DIAGNOSIS — L909 Atrophic disorder of skin, unspecified: Secondary | ICD-10-CM

## 2020-03-30 DIAGNOSIS — Q667 Congenital pes cavus, unspecified foot: Secondary | ICD-10-CM

## 2020-03-30 NOTE — Progress Notes (Signed)
Subjective:  Patient ID: Kelly Andrade, female    DOB: 07/20/1966,  MRN: [id number]  Chief Complaint  Patient presents with   Follow-up    6WK F/U- states when she picked up her custom orthotics she had a good use of them worked for a while but now she is thinking they arch may be too high and causing some discomfort    54 y.o. female presents with the above complaint.  Patient presents with follow-up of left heel pain due to plantar fat pad atrophy.  She states she is doing a lot better.  The orthotics helped however it is hurting her in the arch for which she has an appointment scheduled to see Liliane Channel for reevaluation readjustments.  She denies any other acute complaints.   Review of Systems: Negative except as noted in the HPI. Denies N/V/F/Ch.  Past Medical History:  Diagnosis Date   Fibroids    H/O menorrhagia    Hx of ovarian cyst     Current Outpatient Medications:    Calcium Carb-Cholecalciferol (CALCIUM 1000 + D PO), Take by mouth., Disp: , Rfl:    cetirizine (ZYRTEC) 10 MG tablet, Take 10 mg by mouth daily., Disp: , Rfl:    Ferrous Sulfate (IRON) 325 (65 Fe) MG TABS, Take by mouth., Disp: , Rfl:    fluticasone (FLONASE) 50 MCG/ACT nasal spray, Place into both nostrils daily., Disp: , Rfl:    Multiple Minerals (CALCIUM/MAGNESIUM/ZINC PO), Take 1 tablet by mouth daily., Disp: , Rfl:    Multiple Vitamins-Minerals (MULTIVITAL PO), Take 1 tablet by mouth daily., Disp: , Rfl:    Olopatadine HCl 0.2 % SOLN, Apply to eye., Disp: , Rfl:    Omega-3 Fatty Acids (FISH OIL) 1000 MG CAPS, Take by mouth., Disp: , Rfl:    Polyethylene Glycol 3350 (MIRALAX PO), Take by mouth., Disp: , Rfl:    terbinafine (LAMISIL) 250 MG tablet, Take 250 mg by mouth daily., Disp: , Rfl:   Social History   Tobacco Use  Smoking Status Never Smoker  Smokeless Tobacco Never Used    Allergies  Allergen Reactions   Benadryl [Diphenhydramine Hcl]     Caused hyperactivity as a  child. Caused migraines as a teen   Objective:  There were no vitals filed for this visit. There is no height or weight on file to calculate BMI. Constitutional Well developed. Well nourished.  Vascular Dorsalis pedis pulses palpable bilaterally. Posterior tibial pulses palpable bilaterally. Capillary refill normal to all digits.  No cyanosis or clubbing noted. Pedal hair growth normal.  Neurologic Normal speech. Oriented to person, place, and time. Epicritic sensation to light touch grossly present bilaterally.  Dermatologic Nails well groomed and normal in appearance. No open wounds. No skin lesions.  Orthopedic: Normal joint ROM without pain or crepitus bilaterally. No visible deformities. No pain on palpation to the posterior lateral heel.  No pain at the medial calcaneal tuber.  No pain along the course of the plantar fasciitis.  No pain of the Achilles tendon insertion.  No pain at the ATFL posterior tibial or peroneal tendons.  Mild atrophy of the plantar fat pad noted upon clinical palpation.   Radiographs: 3 views of skeletally mature adult left foot: There is an increase in calcaneal inclination angle.  There is a increase in talar declination angle.  Mild midfoot arthritis noted.  Plantar and posterior heel spurring noted.  Patient is experiencing her exhibiting pes cavus foot structure   Assessment:   1. Plantar fat pad atrophy  2. Pes cavus, congenital   3. Foot pain, left    Plan:  Patient was evaluated and treated and all questions answered.  Left plantar fat pad atrophy -Clinically the pain had was resolved with heel pads and heel lifts.  Patient will benefit from custom-made orthotics for which she has received them however there is some arch pain associated with it therefore she will be seeing Rick right after seeing me for readjustments.  Semiflexible pes cavus foot structure -Patient is coorthotics were dispensed however she is having some arch pain for  which patient will be seen by Liliane Channel after this for adjustments of the orthotics.  No follow-ups on file.

## 2020-04-09 ENCOUNTER — Telehealth: Payer: Self-pay | Admitting: Podiatry

## 2020-04-09 NOTE — Telephone Encounter (Signed)
Pt left message stating she had orthotics and Rick adjusted them some last Friday but still needs a little more adjusting.  I returned call and left message for pt to call to schedule an 30 min appt with Liliane Channel.

## 2020-04-12 ENCOUNTER — Ambulatory Visit: Payer: BC Managed Care – PPO | Admitting: Orthotics

## 2020-04-12 ENCOUNTER — Other Ambulatory Visit: Payer: Self-pay

## 2020-04-12 DIAGNOSIS — L909 Atrophic disorder of skin, unspecified: Secondary | ICD-10-CM

## 2020-04-12 DIAGNOSIS — Q667 Congenital pes cavus, unspecified foot: Secondary | ICD-10-CM

## 2020-04-12 NOTE — Progress Notes (Signed)
Excavated material out of the arch to make it a bit more flexible.  Patient pleased with result.

## 2020-05-25 ENCOUNTER — Encounter: Payer: Self-pay | Admitting: Podiatry

## 2020-05-25 DIAGNOSIS — Z23 Encounter for immunization: Secondary | ICD-10-CM | POA: Diagnosis not present

## 2020-06-04 ENCOUNTER — Ambulatory Visit: Payer: BC Managed Care – PPO | Admitting: Orthotics

## 2020-06-04 ENCOUNTER — Other Ambulatory Visit: Payer: Self-pay

## 2020-06-04 DIAGNOSIS — Q667 Congenital pes cavus, unspecified foot: Secondary | ICD-10-CM

## 2020-06-04 DIAGNOSIS — L909 Atrophic disorder of skin, unspecified: Secondary | ICD-10-CM

## 2020-06-04 NOTE — Progress Notes (Signed)
Sending f/o back to [employer] to be remade with more narrow heel/thinner to fit into MJ type of dress shoe; I am sending a shoe along with the old inserts so lab knows the type of shoe we are trying to accommodate.

## 2020-07-12 ENCOUNTER — Ambulatory Visit: Payer: BC Managed Care – PPO | Admitting: Orthotics

## 2020-07-12 ENCOUNTER — Other Ambulatory Visit: Payer: Self-pay

## 2020-07-12 DIAGNOSIS — Q6671 Congenital pes cavus, right foot: Secondary | ICD-10-CM | POA: Diagnosis not present

## 2020-07-12 DIAGNOSIS — L909 Atrophic disorder of skin, unspecified: Secondary | ICD-10-CM

## 2020-07-12 DIAGNOSIS — Q667 Congenital pes cavus, unspecified foot: Secondary | ICD-10-CM

## 2020-07-12 DIAGNOSIS — Q6672 Congenital pes cavus, left foot: Secondary | ICD-10-CM | POA: Diagnosis not present

## 2020-07-12 NOTE — Progress Notes (Signed)
She wants a second pair however it needs to be more narrow on medial side so as not to push out expensive flats.

## 2020-07-19 ENCOUNTER — Telehealth: Payer: Self-pay | Admitting: Podiatry

## 2020-07-19 NOTE — Telephone Encounter (Signed)
Pt left voicemail on 11.4 while I was out of the office stating she seen Liliane Channel and they were ordering another pair orthotics and pts husband did call the insurance and was told they would cover them as needed. Pt stated she is aware some may got toward her deductible. She thinks Liliane Channel was holding off on the orthotics until she called.

## 2020-08-14 ENCOUNTER — Telehealth: Payer: Self-pay | Admitting: Podiatry

## 2020-08-14 NOTE — Telephone Encounter (Signed)
Pt left message checking on status of orthotics that were ordered.Wanted to make sure they were billed for this yr.   I returned call and it hung up and then pt called me right back.  Orthotics shipped out yesterday and should be in this week. I told pt that it will be billed from the last office visit and I would call her when they come in. I did explain that when ordering this time of yr it takes a little longer due to the demand.

## 2020-08-16 ENCOUNTER — Other Ambulatory Visit: Payer: Self-pay

## 2020-08-16 ENCOUNTER — Ambulatory Visit: Payer: BC Managed Care – PPO | Admitting: Orthotics

## 2020-08-16 DIAGNOSIS — L909 Atrophic disorder of skin, unspecified: Secondary | ICD-10-CM

## 2020-08-16 NOTE — Progress Notes (Signed)
Sending back to richy to make more narrow, more cushion in arch

## 2020-09-11 ENCOUNTER — Other Ambulatory Visit: Payer: Self-pay

## 2020-09-11 ENCOUNTER — Ambulatory Visit: Payer: 59 | Admitting: Orthotics

## 2020-09-11 DIAGNOSIS — Q667 Congenital pes cavus, unspecified foot: Secondary | ICD-10-CM

## 2020-09-11 DIAGNOSIS — L909 Atrophic disorder of skin, unspecified: Secondary | ICD-10-CM

## 2020-09-11 DIAGNOSIS — M79672 Pain in left foot: Secondary | ICD-10-CM

## 2020-09-11 NOTE — Progress Notes (Signed)
Patient came in today to pick up custom made foot orthotics.  The goals were accomplished and the patient reported no dissatisfaction with said orthotics.  Patient was advised of breakin period and how to report any issues. 

## 2020-09-20 ENCOUNTER — Other Ambulatory Visit: Payer: Self-pay

## 2020-09-20 ENCOUNTER — Ambulatory Visit: Payer: 59 | Admitting: Orthotics

## 2020-09-20 DIAGNOSIS — L909 Atrophic disorder of skin, unspecified: Secondary | ICD-10-CM

## 2020-09-20 NOTE — Progress Notes (Signed)
Sending back to richie to reduce ht of left f/o to match right

## 2020-10-12 ENCOUNTER — Telehealth: Payer: Self-pay | Admitting: Podiatry

## 2020-10-12 NOTE — Telephone Encounter (Signed)
Pt returned my call late yesterday evening stating she would like to pick up orthotics when Liliane Channel is in since they have been trying to get them correct for several months now.  I returned call and left message for pt to call me back  to schedule an appt. I could work her in on tues or Thursday late afternoon.

## 2020-10-16 ENCOUNTER — Ambulatory Visit: Payer: 59 | Admitting: Orthotics

## 2020-10-16 ENCOUNTER — Other Ambulatory Visit: Payer: Self-pay

## 2020-10-16 DIAGNOSIS — L909 Atrophic disorder of skin, unspecified: Secondary | ICD-10-CM

## 2020-10-16 DIAGNOSIS — Q667 Congenital pes cavus, unspecified foot: Secondary | ICD-10-CM

## 2020-10-16 NOTE — Progress Notes (Signed)
Picked up adjusted f/o (remade so arch height would be same); she seemed pleased.

## 2021-03-12 ENCOUNTER — Encounter: Payer: Self-pay | Admitting: Podiatry

## 2021-04-12 ENCOUNTER — Other Ambulatory Visit: Payer: Self-pay

## 2021-04-12 ENCOUNTER — Other Ambulatory Visit: Payer: 59

## 2021-04-24 ENCOUNTER — Telehealth: Payer: Self-pay | Admitting: Podiatry

## 2021-04-24 NOTE — Telephone Encounter (Signed)
Pt left message stating she was seen 8.5 and the orthotics were glued back down and she was told by ej if that did not work to drop them off and she can pick them up another day..But she needs them as they do help and is going out of town on 8.26.   I returned call and asked pt if she would like I could send them back to the manufacturer and have them re glue them and try to rush them to have them back before her vacation. She is going to drop them off today and is to call me if the office is closed and I will go out and pick them up.

## 2021-05-07 ENCOUNTER — Telehealth: Payer: Self-pay | Admitting: Podiatry

## 2021-05-07 NOTE — Telephone Encounter (Signed)
Orthotics that were sent back because coming apart are in.. lvm for pt ok to pick up.Marland Kitchen

## 2021-08-02 IMAGING — MG DIGITAL SCREENING BILAT W/ TOMO W/ CAD
8 series · 8 of 24 positions shown · non-contrast
Comparison: Previous exam(s).

CLINICAL DATA: Screening.

EXAM:
DIGITAL SCREENING BILATERAL MAMMOGRAM WITH TOMO AND CAD

[R CC synth-2D]
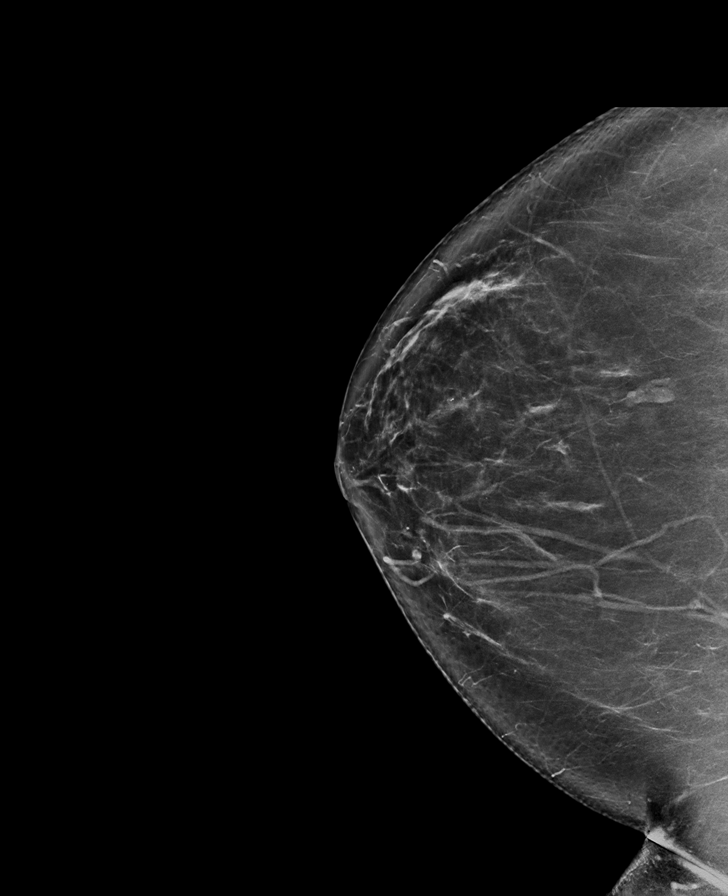

[L CC synth-2D]
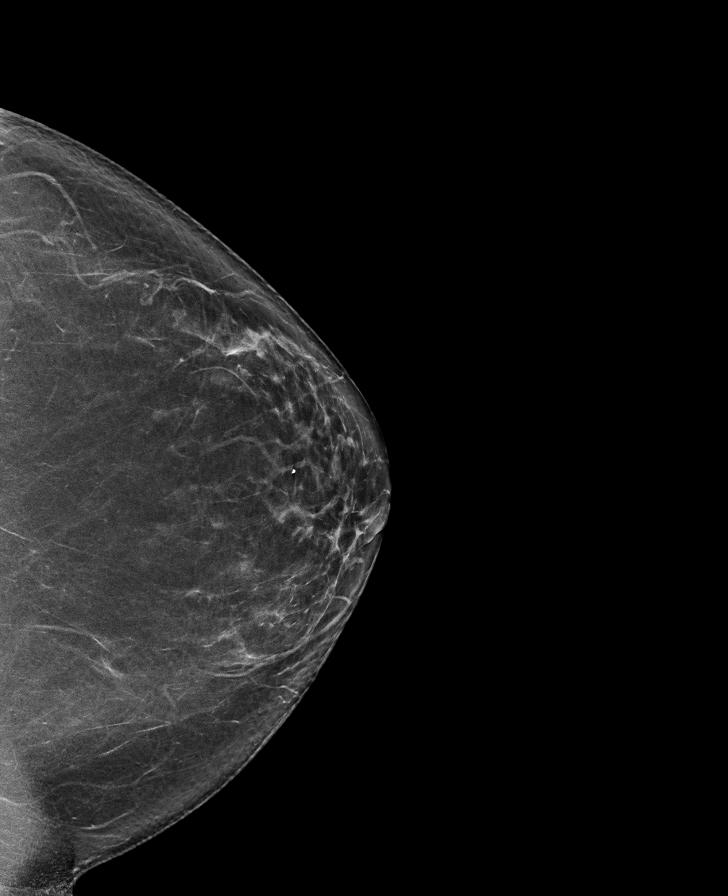

[L MLO synth-2D]
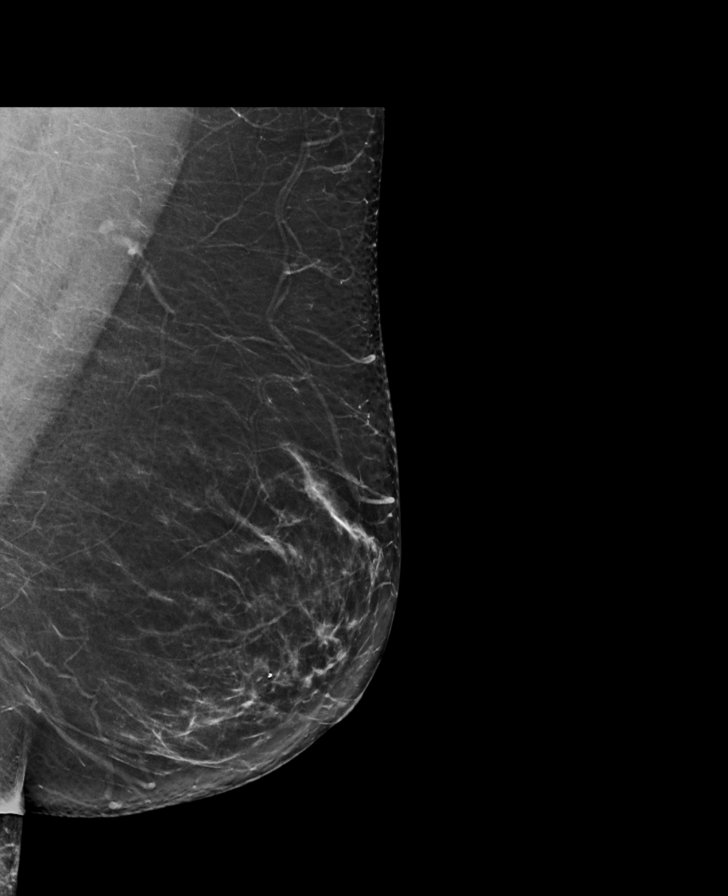

[R MLO synth-2D]
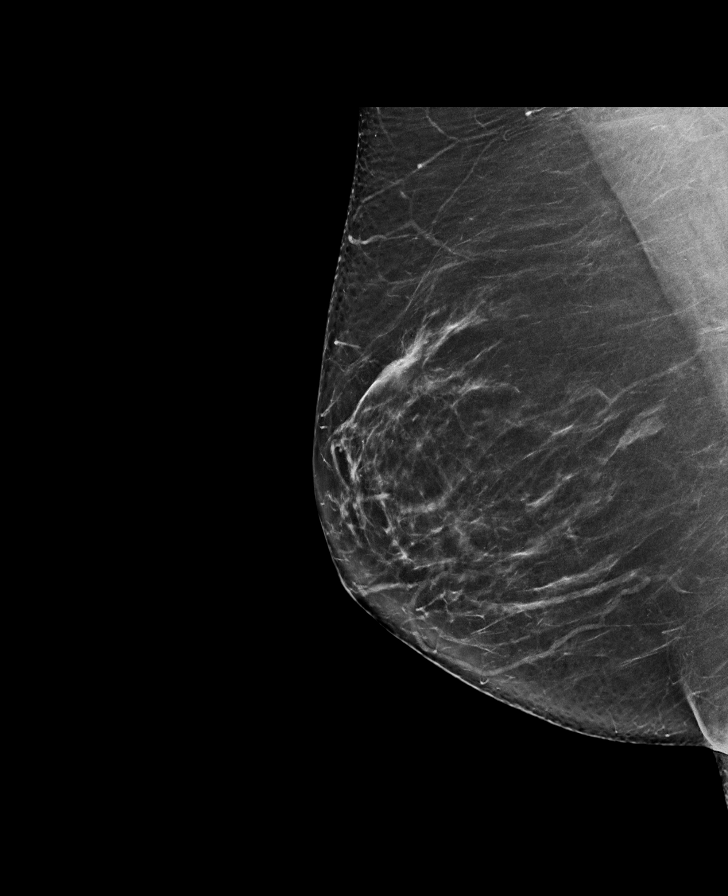

[R MLO tomo · tomo slice 41/80.0]
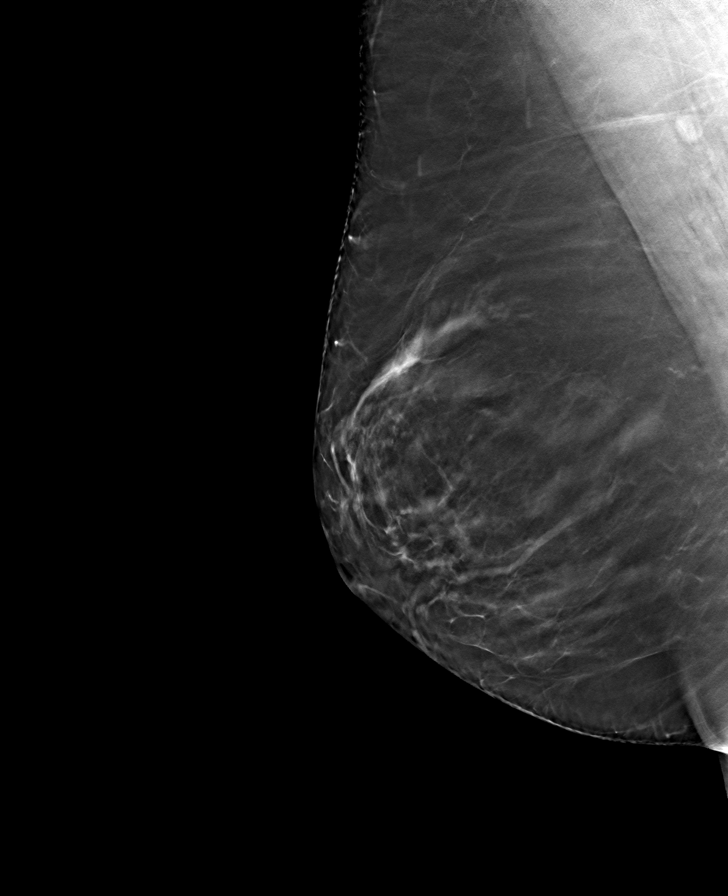

[R CC tomo · tomo slice 43/85.0]
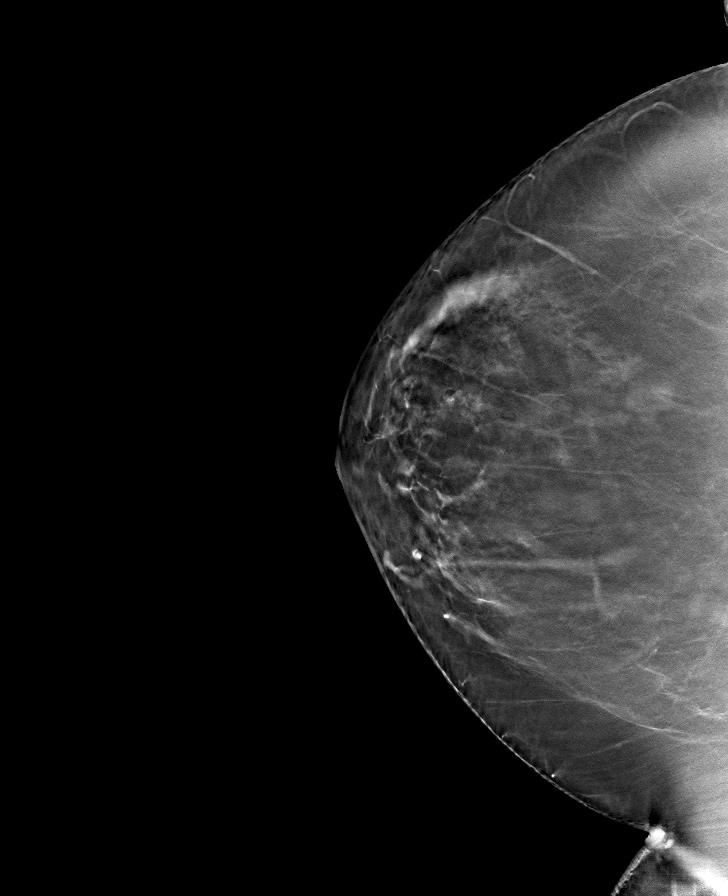

[L MLO tomo · tomo slice 40/79.0]
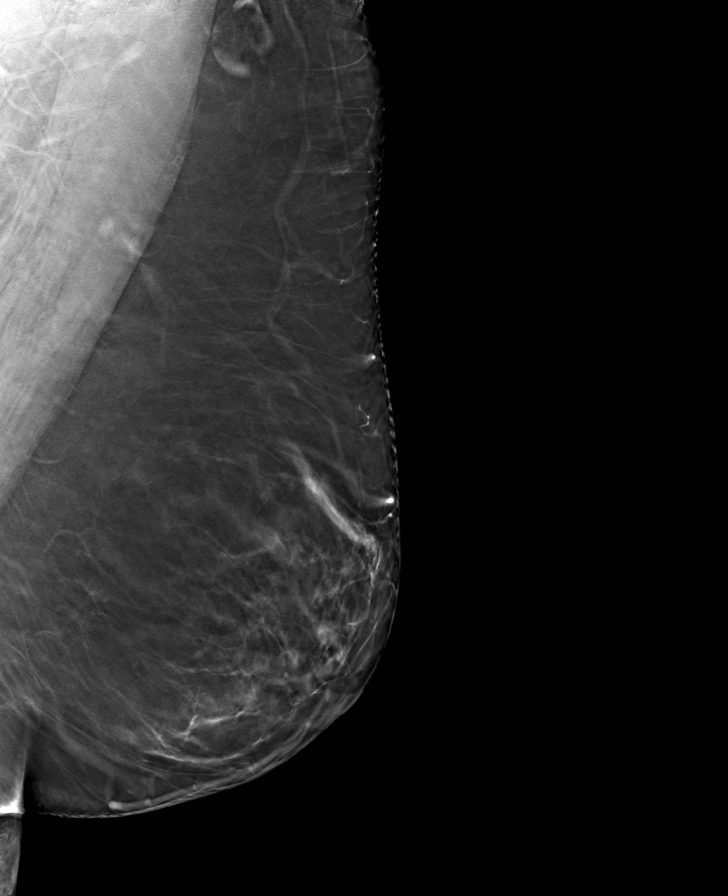

[L CC tomo · tomo slice 41/80.0]
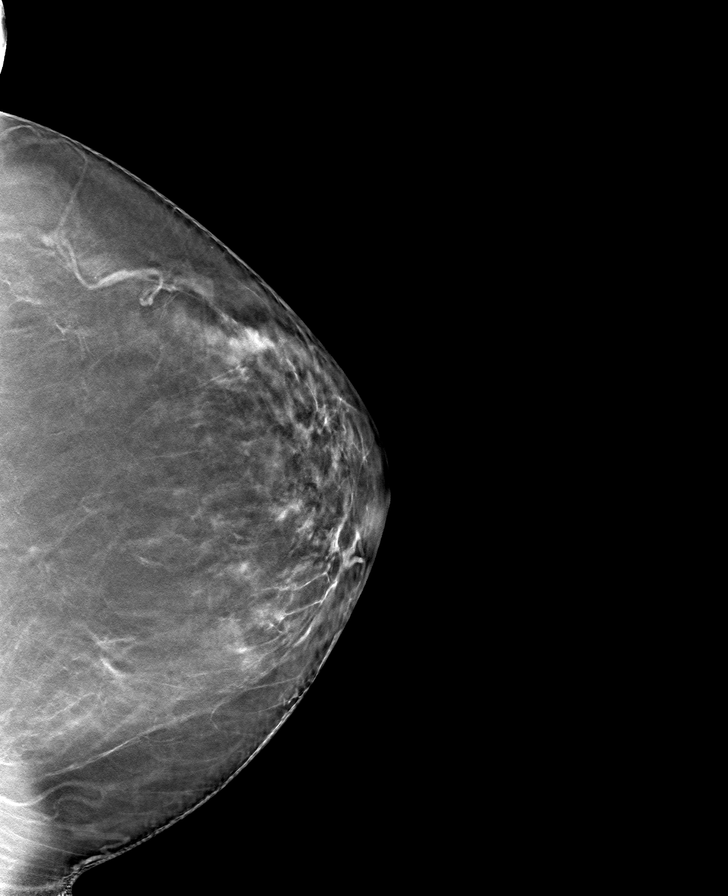

[8 of 24 positions shown; findings below may reference images not displayed]

ACR Breast Density Category b: There are scattered areas of
fibroglandular density.
FINDINGS: In the right breast, a possible mass warrants further evaluation. In
the left breast, no findings suspicious for malignancy. Images were
processed with CAD.
IMPRESSION: Further evaluation is suggested for possible mass in the right
breast.

RECOMMENDATION:
Diagnostic mammogram and possibly ultrasound of the right breast.
(Code:T1-A-550)

The patient will be contacted regarding the findings, and additional
imaging will be scheduled.

BI-RADS CATEGORY  0: Incomplete. Need additional imaging evaluation
and/or prior mammograms for comparison.

## 2022-01-14 ENCOUNTER — Other Ambulatory Visit: Payer: Self-pay | Admitting: Obstetrics and Gynecology

## 2022-01-14 DIAGNOSIS — N63 Unspecified lump in unspecified breast: Secondary | ICD-10-CM

## 2022-02-05 ENCOUNTER — Ambulatory Visit
Admission: RE | Admit: 2022-02-05 | Discharge: 2022-02-05 | Disposition: A | Discharge status: Home / Self Care | Payer: 59 | Source: Ambulatory Visit | Attending: Obstetrics and Gynecology | Admitting: Obstetrics and Gynecology

## 2022-02-05 ENCOUNTER — Ambulatory Visit
Admission: RE | Admit: 2022-02-05 | Discharge: 2022-02-05 | Disposition: A | Discharge status: Home / Self Care | Payer: BC Managed Care – PPO | Source: Ambulatory Visit | Attending: Obstetrics and Gynecology | Admitting: Obstetrics and Gynecology

## 2022-02-05 DIAGNOSIS — N63 Unspecified lump in unspecified breast: Secondary | ICD-10-CM

## 2022-06-17 ENCOUNTER — Telehealth: Payer: Self-pay | Admitting: Podiatry

## 2022-06-17 NOTE — Telephone Encounter (Signed)
Pt called stating she has left a message with our orthotic dept last week and has not heard back and she tried again today to call but no one answered. I asked her if I could try to help and she stated she is not sure what she needs to do as she has a pair of orthotics that are falling apart.  Upon checking the chart the orthotics are over a yr old . I explained that they do not last forever that the options would be to have them refurbished which is a 90.00 charge and insurance does not cover that. It would make them like new again. Or if she wanted to see the provider and we can make a new pair and bill the insurance.  She is going to think about it and get back to [place].

## 2022-07-16 ENCOUNTER — Ambulatory Visit: Payer: 59 | Admitting: Podiatry

## 2022-07-16 ENCOUNTER — Ambulatory Visit (INDEPENDENT_AMBULATORY_CARE_PROVIDER_SITE_OTHER): Payer: 59

## 2022-07-16 DIAGNOSIS — Q666 Other congenital valgus deformities of feet: Secondary | ICD-10-CM

## 2022-07-16 NOTE — Progress Notes (Signed)
  Subjective:  Patient ID: Kelly Andrade, female    DOB: 1966/08/02,  MRN: [id number]  Chief Complaint  Patient presents with   Foot Orthotics    56 y.o. female presents with the above complaint.  Patient presents with bilateral flatfoot deformity for which she had orthotics made last year.  She would like to redo the orthotics as they are starting to get worn out.  She has not seen anyone as prior to seeing me.  Denies any other acute complaints.   Review of Systems: Negative except as noted in the HPI. Denies N/V/F/Ch.  Past Medical History:  Diagnosis Date   Fibroids    H/O menorrhagia    Hx of ovarian cyst     Current Outpatient Medications:    Calcium Carb-Cholecalciferol (CALCIUM 1000 + D PO), Take by mouth., Disp: , Rfl:    cetirizine (ZYRTEC) 10 MG tablet, Take 10 mg by mouth daily., Disp: , Rfl:    Ferrous Sulfate (IRON) 325 (65 Fe) MG TABS, Take by mouth., Disp: , Rfl:    fluticasone (FLONASE) 50 MCG/ACT nasal spray, Place into both nostrils daily., Disp: , Rfl:    Multiple Minerals (CALCIUM/MAGNESIUM/ZINC PO), Take 1 tablet by mouth daily., Disp: , Rfl:    Multiple Vitamins-Minerals (MULTIVITAL PO), Take 1 tablet by mouth daily., Disp: , Rfl:    Olopatadine HCl 0.2 % SOLN, Apply to eye., Disp: , Rfl:    Omega-3 Fatty Acids (FISH OIL) 1000 MG CAPS, Take by mouth., Disp: , Rfl:    Polyethylene Glycol 3350 (MIRALAX PO), Take by mouth., Disp: , Rfl:    terbinafine (LAMISIL) 250 MG tablet, Take 250 mg by mouth daily., Disp: , Rfl:   Social History   Tobacco Use  Smoking Status Never  Smokeless Tobacco Never    Allergies  Allergen Reactions   Benadryl [Diphenhydramine Hcl]     Caused hyperactivity as a child. Caused migraines as a teen   Objective:  There were no vitals filed for this visit. There is no height or weight on file to calculate BMI. Constitutional Well developed. Well nourished.  Vascular Dorsalis pedis pulses palpable bilaterally. Posterior tibial  pulses palpable bilaterally. Capillary refill normal to all digits.  No cyanosis or clubbing noted. Pedal hair growth normal.  Neurologic Normal speech. Oriented to person, place, and time. Epicritic sensation to light touch grossly present bilaterally.  Dermatologic Nails well groomed and normal in appearance. No open wounds. No skin lesions.  Orthopedic: Gait examination shows pes planovalgus foot structure with calcaneovalgus to many toe signs partially recruit the arch with dorsiflexion of the hallux with negative single and double heel raise   Radiographs: None Assessment:   1. Pes planovalgus    Plan:  Patient was evaluated and treated and all questions answered.  Bilateral pes planovalgus -All questions and concerns were discussed with the patient in extensive detail.  I believe she will benefit from another pair of orthotics daily.  Patient presents WNL.  It is maintaining her normal foot alignment and structure. -She will benefit from dress slim size with-year-old black leather.  No follow-ups on file.

## 2022-07-16 NOTE — Progress Notes (Signed)
Patient presents today to be casted for custom molded orthotics. Dr. Posey Pronto has been treating patient for pes planovalgus.   Impression foam cast was taken. ABN signed.  Patient info-  Shoe size: 9 medium women's  Shoe style: Dress shoe   Weight: 218 lbs  Insurance: [employer]   Patient will be notified once orthotics arrive in office and reappoint for fitting at that time.

## 2022-08-12 ENCOUNTER — Ambulatory Visit (INDEPENDENT_AMBULATORY_CARE_PROVIDER_SITE_OTHER): Payer: 59 | Admitting: Podiatry

## 2022-08-12 DIAGNOSIS — Q666 Other congenital valgus deformities of feet: Secondary | ICD-10-CM

## 2022-08-28 ENCOUNTER — Other Ambulatory Visit: Payer: 59

## 2023-06-06 NOTE — Progress Notes (Signed)
Seen by casting department

## 2023-11-06 ENCOUNTER — Other Ambulatory Visit: Payer: Self-pay | Admitting: Obstetrics and Gynecology

## 2023-11-06 DIAGNOSIS — Z1231 Encounter for screening mammogram for malignant neoplasm of breast: Secondary | ICD-10-CM

## 2024-01-06 ENCOUNTER — Ambulatory Visit
Admission: RE | Admit: 2024-01-06 | Discharge: 2024-01-06 | Disposition: A | Discharge status: Home / Self Care | Payer: 59 | Source: Ambulatory Visit | Attending: Obstetrics and Gynecology | Admitting: Obstetrics and Gynecology

## 2024-01-06 DIAGNOSIS — Z1231 Encounter for screening mammogram for malignant neoplasm of breast: Secondary | ICD-10-CM

## 2024-01-13 ENCOUNTER — Ambulatory Visit: Admitting: Podiatry

## 2024-01-13 DIAGNOSIS — M778 Other enthesopathies, not elsewhere classified: Secondary | ICD-10-CM | POA: Diagnosis not present

## 2024-01-13 NOTE — Progress Notes (Signed)
  Subjective:  Patient ID: Kelly Andrade, female    DOB: 18-Feb-1966,  MRN: [id number]  Chief Complaint  Patient presents with   Toe Pain    Right middle toe pain     58 y.o. female presents with the above complaint.  Patient presents with right third digit toe pain.  She states it came out of nowhere has been hurting her for quite some time she has not seen in quite some time she wanted get it evaluated pain scale 5 out of 10 dull aching nature hurts with ambulation as her pressures hurts with resistance of the toe.   Review of Systems: Negative except as noted in the HPI. Denies N/V/F/Ch.  Past Medical History:  Diagnosis Date   Fibroids    H/O menorrhagia    Hx of ovarian cyst     Current Outpatient Medications:    Calcium Carb-Cholecalciferol (CALCIUM 1000 + D PO), Take by mouth., Disp: , Rfl:    cetirizine (ZYRTEC) 10 MG tablet, Take 10 mg by mouth daily., Disp: , Rfl:    Ferrous Sulfate (IRON) 325 (65 Fe) MG TABS, Take by mouth., Disp: , Rfl:    fluticasone (FLONASE) 50 MCG/ACT nasal spray, Place into both nostrils daily., Disp: , Rfl:    Multiple Minerals (CALCIUM/MAGNESIUM/ZINC PO), Take 1 tablet by mouth daily., Disp: , Rfl:    Multiple Vitamins-Minerals (MULTIVITAL PO), Take 1 tablet by mouth daily., Disp: , Rfl:    Olopatadine HCl 0.2 % SOLN, Apply to eye., Disp: , Rfl:    Omega-3 Fatty Acids (FISH OIL) 1000 MG CAPS, Take by mouth., Disp: , Rfl:    Polyethylene Glycol 3350 (MIRALAX PO), Take by mouth., Disp: , Rfl:    terbinafine (LAMISIL) 250 MG tablet, Take 250 mg by mouth daily., Disp: , Rfl:   Social History   Tobacco Use  Smoking Status Never  Smokeless Tobacco Never    Allergies  Allergen Reactions   Benadryl [Diphenhydramine Hcl]     Caused hyperactivity as a child. Caused migraines as a teen   Objective:  There were no vitals filed for this visit. There is no height or weight on file to calculate BMI. Constitutional Well developed. Well nourished.   Vascular Dorsalis pedis pulses palpable bilaterally. Posterior tibial pulses palpable bilaterally. Capillary refill normal to all digits.  No cyanosis or clubbing noted. Pedal hair growth normal.  Neurologic Normal speech. Oriented to person, place, and time. Epicritic sensation to light touch grossly present bilaterally.  Dermatologic Nails well groomed and normal in appearance. No open wounds. No skin lesions.  Orthopedic: Pain along the course of the extensor tendon to the right third digit pain with resisted dorsiflexion of the digit.  No pain at the metatarsophalangeal joint.   Radiographs: None Assessment:   1. Extensor tendinitis of foot    Plan:  Patient was evaluated and treated and all questions answered.  Right third digit extensor tendinitis - All questions and concerns were discussed with the patient in extensive detail given the amount of pain that she is experiencing she will benefit from steroid injection help decrease inflammatory component surgical pain.  Patient agrees with plan like to proceed with steroid injection -A steroid injection was performed at right extensor tendon using 1% plain Lidocaine and 10 mg of Kenalog. This was well tolerated.   No follow-ups on file.

## 2024-03-25 ENCOUNTER — Other Ambulatory Visit (HOSPITAL_COMMUNITY): Payer: Self-pay | Admitting: Family Medicine

## 2024-03-25 ENCOUNTER — Ambulatory Visit (HOSPITAL_COMMUNITY)
Admission: RE | Admit: 2024-03-25 | Discharge: 2024-03-25 | Disposition: A | Discharge status: Home / Self Care | Source: Ambulatory Visit | Attending: Vascular Surgery | Admitting: Vascular Surgery

## 2024-03-25 DIAGNOSIS — R1031 Right lower quadrant pain: Secondary | ICD-10-CM | POA: Insufficient documentation

## 2024-06-29 ENCOUNTER — Encounter: Payer: Self-pay | Admitting: Podiatry

## 2024-07-01 ENCOUNTER — Ambulatory Visit: Admitting: Podiatry

## 2024-07-01 DIAGNOSIS — M778 Other enthesopathies, not elsewhere classified: Secondary | ICD-10-CM

## 2024-07-01 DIAGNOSIS — M7751 Other enthesopathy of right foot: Secondary | ICD-10-CM | POA: Diagnosis not present

## 2024-07-01 NOTE — Progress Notes (Signed)
  Subjective:  Patient ID: Kelly Andrade, female    DOB: 1966/01/02,  MRN: [id number]  Chief Complaint  Patient presents with   Injections    58 y.o. female presents with the above complaint.  Patient presents for follow-up of right third digit extensor tendon she states that started coming back denies any other acute complaints would like to discuss another steroid shot.   Review of Systems: Negative except as noted in the HPI. Denies N/V/F/Ch.  Past Medical History:  Diagnosis Date   Fibroids    H/O menorrhagia    Hx of ovarian cyst     Current Outpatient Medications:    Calcium Carb-Cholecalciferol (CALCIUM 1000 + D PO), Take by mouth., Disp: , Rfl:    cetirizine (ZYRTEC) 10 MG tablet, Take 10 mg by mouth daily., Disp: , Rfl:    Ferrous Sulfate (IRON) 325 (65 Fe) MG TABS, Take by mouth., Disp: , Rfl:    fluticasone (FLONASE) 50 MCG/ACT nasal spray, Place into both nostrils daily., Disp: , Rfl:    Multiple Minerals (CALCIUM/MAGNESIUM/ZINC PO), Take 1 tablet by mouth daily., Disp: , Rfl:    Multiple Vitamins-Minerals (MULTIVITAL PO), Take 1 tablet by mouth daily., Disp: , Rfl:    Olopatadine HCl 0.2 % SOLN, Apply to eye., Disp: , Rfl:    Omega-3 Fatty Acids (FISH OIL) 1000 MG CAPS, Take by mouth., Disp: , Rfl:    Polyethylene Glycol 3350 (MIRALAX PO), Take by mouth., Disp: , Rfl:    terbinafine (LAMISIL) 250 MG tablet, Take 250 mg by mouth daily., Disp: , Rfl:   Social History   Tobacco Use  Smoking Status Never  Smokeless Tobacco Never    Allergies  Allergen Reactions   Benadryl [Diphenhydramine Hcl]     Caused hyperactivity as a child. Caused migraines as a teen   Objective:  There were no vitals filed for this visit. There is no height or weight on file to calculate BMI. Constitutional Well developed. Well nourished.  Vascular Dorsalis pedis pulses palpable bilaterally. Posterior tibial pulses palpable bilaterally. Capillary refill normal to all digits.  No  cyanosis or clubbing noted. Pedal hair growth normal.  Neurologic Normal speech. Oriented to person, place, and time. Epicritic sensation to light touch grossly present bilaterally.  Dermatologic Nails well groomed and normal in appearance. No open wounds. No skin lesions.  Orthopedic: Pain along the course of the extensor tendon to the right third digit pain with resisted dorsiflexion of the digit.  No pain at the metatarsophalangeal joint.   Radiographs: None Assessment:   No diagnosis found.  Plan:  Patient was evaluated and treated and all questions answered.  Right third digit extensor tendinitis - All questions and concerns were discussed with the patient in extensive detail given the amount of pain that she is experiencing she will benefit from steroid injection help decrease inflammatory component surgical pain.  Patient agrees with plan like to proceed with steroid injection -A steroid injection was performed at right extensor tendon using 1% plain Lidocaine and 10 mg of Kenalog. This was well tolerated.   No follow-ups on file.
